# Patient Record
Sex: Female | Born: 1985 | Race: Black or African American | Hispanic: No | Marital: Single
Health system: Southern US, Community
[De-identification: ages and names within clinical notes are randomized; demographics above are authoritative.]

---
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
## 2008-03-09 ENCOUNTER — Emergency Department (HOSPITAL_COMMUNITY): Admission: EM | Admit: 2008-03-09 | Discharge: 2008-03-09 | Payer: Self-pay | Admitting: Emergency Medicine

## 2008-04-09 ENCOUNTER — Emergency Department (HOSPITAL_COMMUNITY): Admission: EM | Admit: 2008-04-09 | Discharge: 2008-04-09 | Payer: Self-pay | Admitting: Emergency Medicine

## 2008-06-04 ENCOUNTER — Emergency Department (HOSPITAL_COMMUNITY): Admission: EM | Admit: 2008-06-04 | Discharge: 2008-06-04 | Payer: Self-pay | Admitting: Emergency Medicine

## 2008-06-06 ENCOUNTER — Emergency Department (HOSPITAL_COMMUNITY): Admission: EM | Admit: 2008-06-06 | Discharge: 2008-06-06 | Payer: Self-pay | Admitting: Emergency Medicine

## 2010-03-05 ENCOUNTER — Encounter: Payer: Self-pay | Admitting: Neurosurgery

## 2010-05-24 LAB — URINALYSIS, ROUTINE W REFLEX MICROSCOPIC
Leukocytes, UA: NEGATIVE
Protein, ur: NEGATIVE mg/dL
Specific Gravity, Urine: >1.03 — ABNORMAL HIGH (ref 1.005–1.030)
pH: 6 (ref 5.0–8.0)

## 2010-05-24 LAB — PREGNANCY, URINE: Preg Test, Ur: NEGATIVE

## 2010-05-24 LAB — URINE MICROSCOPIC-ADD ON

## 2010-05-30 LAB — DIFFERENTIAL
Lymphs Abs: 1.4 10*3/uL (ref 0.7–4.0)
Monocytes Relative: 10 % (ref 3–12)
Neutro Abs: 2.6 10*3/uL (ref 1.7–7.7)
Neutrophils Relative %: 57 % (ref 43–77)

## 2010-05-30 LAB — CBC
Hemoglobin: 11.9 g/dL — ABNORMAL LOW (ref 12.0–15.0)
MCV: 89.9 fL (ref 78.0–100.0)
Platelets: 160 10*3/uL (ref 150–400)
RBC: 3.92 MIL/uL (ref 3.87–5.11)

## 2010-05-30 LAB — URINALYSIS, ROUTINE W REFLEX MICROSCOPIC
Nitrite: NEGATIVE
Specific Gravity, Urine: 1.025 (ref 1.005–1.030)
pH: 6 (ref 5.0–8.0)

## 2010-05-30 LAB — COMPREHENSIVE METABOLIC PANEL
Albumin: 3.9 g/dL (ref 3.5–5.2)
CO2: 27 mEq/L (ref 19–32)
Chloride: 109 mEq/L (ref 96–112)
Creatinine, Ser: 0.67 mg/dL (ref 0.4–1.2)
Glucose, Bld: 100 mg/dL — ABNORMAL HIGH (ref 70–99)

## 2010-05-30 LAB — PREGNANCY, URINE: Preg Test, Ur: NEGATIVE

## 2010-05-30 LAB — URINE MICROSCOPIC-ADD ON

## 2010-05-30 LAB — RAPID STREP SCREEN (MED CTR MEBANE ONLY): Streptococcus, Group A Screen (Direct): NEGATIVE
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
## 2010-06-30 NOTE — Op Note (Signed)
Veronica Saunders, Veronica Saunders                       ACCOUNT NO.:  [id number]   MEDICAL RECORD NO.:  [id number]                   PATIENT TYPE:  OIB   LOCATION:  NA                                   FACILITY:  [facility]   PHYSICIAN:  Abigail Miyamoto, M.D.              DATE OF BIRTH:  1985-06-09   DATE OF PROCEDURE:  07/28/2002  DATE OF DISCHARGE:                                 OPERATIVE REPORT   PREOPERATIVE DIAGNOSIS:  Symptomatic cholelithiasis.   POSTOPERATIVE DIAGNOSIS:  Symptomatic cholelithiasis.   PROCEDURE:  Laparoscopic cholecystectomy with intraoperative cholangiogram.   SURGEON:  Abigail Miyamoto, M.D.   ANESTHESIA:  General endotracheal anesthesia.   ESTIMATED BLOOD LOSS:  Minimal.   FINDINGS:  The patient was found to have a normal cholangiogram.   PROCEDURE IN DETAIL:  The patient was brought to the operating room and  identified as [name].  She was placed supine on the operating  room table, and general anesthesia was induced.  Her abdomen was then  prepped and draped in the usual sterile fashion.  Using the 15 blade, a  small transverse incision was made below the umbilicus.  The incision was  carried down to the fascia, which was then opened with a scalpel.  A  hemostat was then used to pass through the peritoneal cavity.  Next a 0  Vicryl pursestring suture was placed around the fascial opening.  The Hasson  port was placed through the opening and insufflation of the abdomen was  begun.  Next a 5 mm port was placed in the patient's epigastrium and two 5  mm ports were placed in the patient's right flank under direct vision.  The  gallbladder was then grasped and retracted above the liver bed.  Dissection  was then carried out at the base of the gallbladder.  The cystic duct was  identified and clipped once distally and partly opened with a scissors.  An  angiocatheter was then inserted under direct vision in the right upper  quadrant.  The  cholangiocatheter was passed through this and placed into the  opening in the cystic duct.  A cholangiogram was then performed with  contrast under direct fluoroscopy.  Good flow of contrast was seen into the  entire biliary system without evidence of obstruction.  The  cholangiocatheter was then removed under direct vision.  The  cholangiocatheter was then removed under direct vision.  The cystic duct was  then clipped three times proximally and transected with the scissors.  The  cystic artery and a branch were identified and clipped proximally and  distally and transected as well.  The gallbladder was then slowly dissected  free from the liver bed with the electrocautery.  During the dissection the  gallbladder was opened and several stones spilled out.  Once the gallbladder  was freed from the liver bed, it was placed in an Endosac and removed from  the umbilicus.  A large suction device and scooper was used to remove the  rest of the stones.  The liver bed was irrigated again and hemostasis was  found to be achieved.  The abdomen was thoroughly irrigated with normal  saline.  Again hemostasis appeared to be achieved.  All ports were then  removed under direct vision and the abdomen was deflated.  All incisions  were then anesthetized with 0.25% Marcaine, then  closed with 4-0 Vicryl subcuticular sutures.  Steri-Strips, gauze, and tape  were applied.  The patient tolerated the procedure well.  All sponge,  needle, and instrument counts were correct at the end of the procedure.  The  patient was then extubated in the operating room and taken in stable  condition to the recovery room.                                               Abigail Miyamoto, M.D.    DB/MEDQ  D:  07/28/2002  T:  07/28/2002  Job:  [id number]

## 2018-05-04 ENCOUNTER — Other Ambulatory Visit: Payer: Self-pay

## 2018-05-04 ENCOUNTER — Encounter (HOSPITAL_COMMUNITY): Payer: Self-pay | Admitting: Emergency Medicine

## 2018-05-04 ENCOUNTER — Emergency Department (HOSPITAL_COMMUNITY)
Admission: EM | Admit: 2018-05-04 | Discharge: 2018-05-05 | Disposition: A | Discharge status: Home / Self Care | Payer: Self-pay | Attending: Emergency Medicine | Admitting: Emergency Medicine

## 2018-05-04 DIAGNOSIS — N898 Other specified noninflammatory disorders of vagina: Secondary | ICD-10-CM | POA: Insufficient documentation

## 2018-05-04 LAB — WET PREP, GENITAL
Clue Cells Wet Prep HPF POC: NONE SEEN
Sperm: NONE SEEN
Trich, Wet Prep: NONE SEEN
YEAST WET PREP: NONE SEEN

## 2018-05-04 NOTE — ED Provider Notes (Signed)
[facility] EMERGENCY DEPARTMENT Provider Note   CSN: [id number] Arrival date & time: 05/04/18  2245    History   Chief Complaint No chief complaint on file.   HPI YI HINCHMAN is a 33 y.o. female.     Patient is a 33 year old female with no significant past medical history.  She presents today for evaluation of vaginal discharge.  This is been ongoing for the past 2 days.  She describes a white, foul-smelling, watery discharge that has worsened.  She denies any recent sexual activity.  She tells me she was seen in February with similar complaints and had negative work-up.  She denies any abdominal pain, fevers, or chills.  She denies any urinary complaints.  The history is provided by the patient.    History reviewed. No pertinent past medical history.  There are no active problems to display for this patient.   History reviewed. No pertinent surgical history.   OB History   No obstetric history on file.      Home Medications    Prior to Admission medications   Not on File    Family History No family history on file.  Social History Social History   Tobacco Use  . Smoking status: Not on file  Substance Use Topics  . Alcohol use: Not on file  . Drug use: Not on file     Allergies   Patient has no allergy information on record.   Review of Systems Review of Systems  All other systems reviewed and are negative.    Physical Exam Updated Vital Signs BP 103/81   Pulse 86   Temp 98.3 F (36.8 C) (Oral)   Resp 16   Ht 4\' 10"  (1.473 m)   Wt 59 kg   SpO2 100%   BMI 27.17 kg/m   Physical Exam Vitals signs and nursing note reviewed.  Constitutional:      General: She is not in acute distress.    Appearance: Normal appearance. She is not ill-appearing, toxic-appearing or diaphoretic.  HENT:     Head: Normocephalic.  Pulmonary:     Effort: Pulmonary effort is normal.  Abdominal:     General: Abdomen is flat. There is  no distension.     Tenderness: There is no abdominal tenderness.  Genitourinary:    General: Normal vulva.     Vagina: Vaginal discharge present.     Comments: There is a whitish discharge present.  There is no adnexal tenderness or masses.  There is no cervical motion tenderness. Skin:    General: Skin is warm and dry.  Neurological:     Mental Status: She is alert.      ED Treatments / Results  Labs (all labs ordered are listed, but only abnormal results are displayed) Labs Reviewed  WET PREP, GENITAL  URINALYSIS, ROUTINE W REFLEX MICROSCOPIC  PREGNANCY, URINE  GC/CHLAMYDIA PROBE AMP () NOT AT [facility]    EKG None  Radiology No results found.  Procedures Procedures (including critical care time)  Medications Ordered in ED Medications - No data to display   Initial Impression / Assessment and Plan / ED Course  I have reviewed the triage vital signs and the nursing notes.  Pertinent labs & imaging results that were available during my care of the patient were reviewed by me and considered in my medical decision making (see chart for details).  Patient presents with vaginal discharge with foul odor for the past several days.  Wet prep shows many white cells, but no evidence for BV or yeast.  Patient denies being sexually active for the past several months.  I am uncertain as to what is causing the discharge, however clinically it appears to be BV.  She will be treated with Rocephin and Zithromax and given Flagyl.  Final Clinical Impressions(s) / ED Diagnoses   Final diagnoses:  None    ED Discharge Orders    None       Geoffery Lyons, MD 05/05/18 [phone]

## 2018-05-04 NOTE — ED Triage Notes (Signed)
Pt is having foul discharge since yesterday. Pt states it is not painful. Pt states she hasnt been having sex. Pt was tested for STD in February and it came back negative

## 2018-05-05 LAB — URINALYSIS, ROUTINE W REFLEX MICROSCOPIC
BILIRUBIN URINE: NEGATIVE
Bacteria, UA: NONE SEEN
Glucose, UA: NEGATIVE mg/dL
HGB URINE DIPSTICK: NEGATIVE
KETONES UR: NEGATIVE mg/dL
LEUKOCYTE UA: NEGATIVE
Nitrite: NEGATIVE
PH: 7 (ref 5.0–8.0)
Protein, ur: 30 mg/dL — AB
Specific Gravity, Urine: 1.023 (ref 1.005–1.030)

## 2018-05-05 LAB — PREGNANCY, URINE: Preg Test, Ur: NEGATIVE

## 2018-05-05 MED ORDER — METRONIDAZOLE 500 MG PO TABS: 500.0000 mg | ORAL_TABLET | Freq: Two times a day (BID) | ORAL | 0 refills | Status: AC

## 2018-05-05 MED ORDER — CEFTRIAXONE SODIUM 250 MG IJ SOLR
250.0000 mg | Freq: Once | INTRAMUSCULAR | Status: DC
  Filled 2018-05-05: qty 250

## 2018-05-05 MED ORDER — LIDOCAINE HCL (PF) 1 % IJ SOLN
2.0000 mL | Freq: Once | INTRAMUSCULAR | Status: DC
  Filled 2018-05-05: qty 5

## 2018-05-05 MED ORDER — AZITHROMYCIN 250 MG PO TABS
1000.0000 mg | ORAL_TABLET | Freq: Once | ORAL | Status: DC
  Filled 2018-05-05: qty 4

## 2018-05-05 NOTE — Discharge Instructions (Addendum)
Flagyl as prescribed.  We will call you if your cultures indicate you require further treatment or action.  Follow-up with your primary doctor if not improving in the next few days.

## 2018-05-06 LAB — GC/CHLAMYDIA PROBE AMP (~~LOC~~) NOT AT ARMC
CHLAMYDIA, DNA PROBE: NEGATIVE
NEISSERIA GONORRHEA: NEGATIVE

## 2019-07-18 ENCOUNTER — Emergency Department: Payer: Auto Insurance (includes no fault)

## 2019-07-18 ENCOUNTER — Emergency Department
Admission: EM | Admit: 2019-07-18 | Discharge: 2019-07-18 | Disposition: A | Discharge status: Home / Self Care | Payer: Auto Insurance (includes no fault) | Attending: Emergency Medicine | Admitting: Emergency Medicine

## 2019-07-18 DIAGNOSIS — S61412A Laceration without foreign body of left hand, initial encounter: Secondary | ICD-10-CM | POA: Insufficient documentation

## 2019-07-18 DIAGNOSIS — S60212A Contusion of left wrist, initial encounter: Secondary | ICD-10-CM | POA: Insufficient documentation

## 2019-07-18 MED ORDER — HYDROCODONE-ACETAMINOPHEN 5-325 MG PO TABS
1.0000 | ORAL_TABLET | Freq: Once | ORAL | Status: AC
Start: 2019-07-18 — End: 2019-07-18
  Administered 2019-07-18: 15:00:00 1 via ORAL

## 2019-07-18 MED ORDER — HYDROCODONE-ACETAMINOPHEN 5-325 MG PO TABS
ORAL_TABLET | ORAL | Status: AC
Start: 2019-07-18 — End: ?
  Filled 2019-07-18: qty 1
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
